# Patient Record
Sex: Male | Born: 1975 | Race: White | Hispanic: No | Marital: Married | Smoking: Never smoker
Health system: Southern US, Community
[De-identification: ages and names within clinical notes are randomized; demographics above are authoritative.]

## PROBLEM LIST (undated history)

## (undated) DIAGNOSIS — N200 Calculus of kidney: Secondary | ICD-10-CM

## (undated) HISTORY — PX: HERNIA REPAIR: SHX51

---

## 2018-06-29 ENCOUNTER — Emergency Department (HOSPITAL_BASED_OUTPATIENT_CLINIC_OR_DEPARTMENT_OTHER): Payer: Worker's Compensation

## 2018-06-29 ENCOUNTER — Other Ambulatory Visit: Payer: Self-pay

## 2018-06-29 ENCOUNTER — Encounter (HOSPITAL_BASED_OUTPATIENT_CLINIC_OR_DEPARTMENT_OTHER): Payer: Self-pay | Admitting: Emergency Medicine

## 2018-06-29 ENCOUNTER — Emergency Department (HOSPITAL_BASED_OUTPATIENT_CLINIC_OR_DEPARTMENT_OTHER)
Admission: EM | Admit: 2018-06-29 | Discharge: 2018-06-29 | Disposition: A | Discharge status: Home / Self Care | Payer: Worker's Compensation | Attending: Emergency Medicine | Admitting: Emergency Medicine

## 2018-06-29 DIAGNOSIS — S61217A Laceration without foreign body of left little finger without damage to nail, initial encounter: Secondary | ICD-10-CM

## 2018-06-29 DIAGNOSIS — Y929 Unspecified place or not applicable: Secondary | ICD-10-CM | POA: Diagnosis not present

## 2018-06-29 DIAGNOSIS — Y990 Civilian activity done for income or pay: Secondary | ICD-10-CM | POA: Insufficient documentation

## 2018-06-29 DIAGNOSIS — S62667B Nondisplaced fracture of distal phalanx of left little finger, initial encounter for open fracture: Secondary | ICD-10-CM | POA: Diagnosis not present

## 2018-06-29 DIAGNOSIS — Y9389 Activity, other specified: Secondary | ICD-10-CM | POA: Diagnosis not present

## 2018-06-29 DIAGNOSIS — W230XXA Caught, crushed, jammed, or pinched between moving objects, initial encounter: Secondary | ICD-10-CM | POA: Insufficient documentation

## 2018-06-29 DIAGNOSIS — Z23 Encounter for immunization: Secondary | ICD-10-CM | POA: Insufficient documentation

## 2018-06-29 HISTORY — DX: Calculus of kidney: N20.0

## 2018-06-29 MED ORDER — TETANUS-DIPHTH-ACELL PERTUSSIS 5-2.5-18.5 LF-MCG/0.5 IM SUSP
0.5000 mL | Freq: Once | INTRAMUSCULAR | Status: AC
  Administered 2018-06-29: 0.5 mL via INTRAMUSCULAR
  Filled 2018-06-29: qty 0.5

## 2018-06-29 NOTE — ED Provider Notes (Signed)
[facility] EMERGENCY DEPARTMENT Provider Note   CSN: [id number] Arrival date & time: 06/29/18  4098     History   Chief Complaint Chief Complaint  Patient presents with  . Laceration    HPI Edward Elliott is a 42 y.o. male.  The history is provided by the patient and medical records. No language interpreter was used.  Laceration   The incident occurred less than 1 hour ago. The laceration is located on the left hand. The laceration is 2 cm in size. The laceration mechanism was a a blunt object. The pain is at a severity of 3/10. The pain is mild. The pain has been constant since onset. He reports no foreign bodies present. His tetanus status is unknown.    Past Medical History:  Diagnosis Date  . Kidney calculi     There are no active problems to display for this patient.      Home Medications    Prior to Admission medications   Not on File    Family History No family history on file.  Social History Social History   Tobacco Use  . Smoking status: Never Smoker  Substance Use Topics  . Alcohol use: Not on file  . Drug use: Not on file     Allergies   Amoxicillin and Augmentin [amoxicillin-pot clavulanate]   Review of Systems Review of Systems  Constitutional: Negative for chills, fatigue and fever.  HENT: Negative for congestion.   Eyes: Negative for visual disturbance.  Respiratory: Negative for chest tightness and shortness of breath.   Cardiovascular: Negative for chest pain.  Gastrointestinal: Negative for abdominal pain, nausea and vomiting.  Genitourinary: Negative for difficulty urinating, dysuria, flank pain and frequency.  Musculoskeletal: Negative for back pain.  Skin: Positive for wound. Negative for rash.  Neurological: Negative for dizziness, weakness, light-headedness and headaches.  Psychiatric/Behavioral: Negative for agitation.  All other systems reviewed and are negative.    Physical Exam Updated Vital Signs BP  (!) 153/107   Pulse 68   Temp 98.6 F (37 C) (Oral)   Resp 18   Ht 5\' 9"  (1.753 m)   Wt 103.9 kg   SpO2 98%   BMI 33.82 kg/m   Physical Exam  Constitutional: He appears well-developed and well-nourished. No distress.  HENT:  Head: Normocephalic and atraumatic.  Eyes: Conjunctivae are normal.  Neck: Neck supple.  Cardiovascular: Normal rate.  No murmur heard. Pulmonary/Chest: Effort normal and breath sounds normal. No respiratory distress. He has no wheezes. He exhibits no tenderness.  Abdominal: Soft. There is no tenderness.  Musculoskeletal: He exhibits tenderness. He exhibits no edema.       Left hand: He exhibits tenderness and laceration. He exhibits normal capillary refill and no deformity. Normal sensation noted. Normal strength noted.       Hands: 2 cm laceration dorsum of the left fifth digit.  Punctate, 1 mm laceration on the pad.  Normal cap refill.  Normal sensation.  Normal finger movement.  Normal pulse.  No snuffbox tenderness.  Neurological: He is alert. No sensory deficit. He exhibits normal muscle tone.  Skin: Skin is warm and dry. Capillary refill takes less than 2 seconds. He is not diaphoretic. No erythema. No pallor.  Psychiatric: He has a normal mood and affect.  Nursing note and vitals reviewed.    ED Treatments / Results  Labs (all labs ordered are listed, but only abnormal results are displayed) Labs Reviewed - No data to display  EKG None  Radiology No results found.  Procedures .Marland KitchenLaceration Repair Date/Time: 06/29/2018 9:12 AM Performed by: Heide Scales, MD Authorized by: Heide Scales, MD   Consent:    Consent obtained:  Verbal   Consent given by:  Patient   Risks discussed:  Infection, pain, poor cosmetic result and poor wound healing   Alternatives discussed:  No treatment Anesthesia (see MAR for exact dosages):    Anesthesia method:  None Laceration details:    Location:  Finger   Finger location:  L small  finger   Length (cm):  2   Depth (mm):  1 Repair type:    Repair type:  Simple Pre-procedure details:    Preparation:  Imaging obtained to evaluate for foreign bodies Exploration:    Wound exploration: wound explored through full range of motion     Wound extent: underlying fracture (possible)     Wound extent: no foreign bodies/material noted, no muscle damage noted, no nerve damage noted, no tendon damage noted and no vascular damage noted     Contaminated: no   Treatment:    Area cleansed with:  Saline   Amount of cleaning:  Standard   Irrigation solution:  Sterile water   Irrigation method:  Syringe   Visualized foreign bodies/material removed: no   Skin repair:    Repair method:  Tissue adhesive Approximation:    Approximation:  Close Post-procedure details:    Dressing:  Non-adherent dressing and sterile dressing   Patient tolerance of procedure:  Tolerated well, no immediate complications   (including critical care time)  Medications Ordered in ED Medications  Tdap (BOOSTRIX) injection 0.5 mL (0.5 mLs Intramuscular Given 06/29/18 0724)     Initial Impression / Assessment and Plan / ED Course  I have reviewed the triage vital signs and the nursing notes.  Pertinent labs & imaging results that were available during my care of the patient were reviewed by me and considered in my medical decision making (see chart for details).     Edward Elliott is a 42 y.o. male with a past medical history significant for kidney stone who presents with hand injury.  Patient reports he is right-handed.  He says that he was moving a rolling heavy table when his left pinky got caught between 2 tables.  Reports there would not topped.  He reports that the tables were cleaned.  He says that he sustained an injury to the left pinky as it was shared between them.  He reports it was tingly at first but then improved.  He reports a laceration on both sides of the finger.  He reports the pain is now  a 3 out of 10 and mild.  He has no other complaints of injuries.  No preceding symptoms.  No other complaints.  He is uncertain of his last tetanus shot.  On exam, patient is a 2 cm laceration on the dorsal side of the fifth digit of the left hand.  There is also a punctate 1 mm cut on the pad of the fifth digit.  Patient had normal radial pulse.  Normal sensation.  No snuffbox tenderness.  Lungs clear chest nontender.  Patient otherwise appears well.  Patient with x-ray to look for fracture with his laceration.  After x-ray to rule out foreign bodies and fracture, anticipate laceration repair with stitches and hand follow-up.       Tdap will be ordered.  9:14 AM X-ray showed possible fracture of the distal phalanx.  It is  nondisplaced.  A foreign body was seen but at a different location, do not feel there is foreign body in the injury today.  Shared decision making conversation held with patient as to further management.  Patient is adverse to needles and does not want sutures.  He is very happy with skin glue.  I advised that the wound may not heal quite as well or come together as smoothly however he continues to want glue.  He understands risk for scar and infections.  Patient's wound was washed without difficulty and skin glue was applied to the lacerations.  Wound was wrapped and finger was splinted.  Patient will follow-up with PCP and was also given follow-up for hand surgery if symptoms arise.  We discussed possibility of antibiotics and decided to hold on antibiotics at this time given the only possibility of fracture, clean nature of the injury, and well appearance.  Patient understands return precautions.  Patient with questions or concerns and was discharged in good condition.    Final Clinical Impressions(s) / ED Diagnoses   Final diagnoses:  Laceration of left little finger without damage to nail, foreign body presence unspecified, initial encounter  Open nondisplaced fracture of  distal phalanx of left little finger, initial encounter    ED Discharge Orders    None     Clinical Impression: 1. Laceration of left little finger without damage to nail, foreign body presence unspecified, initial encounter   2. Open nondisplaced fracture of distal phalanx of left little finger, initial encounter     Disposition: Discharge  Condition: Good  I have discussed the results, Dx and Tx plan with the pt(& family if present). He/she/they expressed understanding and agree(s) with the plan. Discharge instructions discussed at great length. Strict return precautions discussed and pt &/or family have verbalized understanding of the instructions. No further questions at time of discharge.    New Prescriptions   No medications on file    Follow Up: Knute Neu, MD [address] [place] [address] [phone]     [facility] [place] [facility] [address] [id number] YQ [facility] [place] [address] [phone]       Edward Elliott, Canary Brim, MD 06/29/18 [id number]

## 2018-06-29 NOTE — ED Triage Notes (Signed)
Pt was at work and caught left pinky in between to rolling metal tables. Bleeding controlled in triage. PMS intact.

## 2018-06-29 NOTE — Discharge Instructions (Signed)
Your work-up today showed possible finger fracture but definite lacerations.  After our shared decision-making conversation, we agreed to use the skin glue to seal the wound.  Despite washing and tetanus update, there are still chances for infection, please return if any symptoms of infection arise.  Please use the splint for the next few days.  If you continue to have any pain or problems, please follow-up with the hand surgery team.  Please keep the wound clean.

## 2020-05-21 IMAGING — CR DG FINGER LITTLE 2+V*L*
3 series · 3 of 3 positions shown · non-contrast
Comparison: None.

CLINICAL DATA: Crush injury at work.

EXAM:
LEFT LITTLE FINGER 2+V

[x finger pa left]
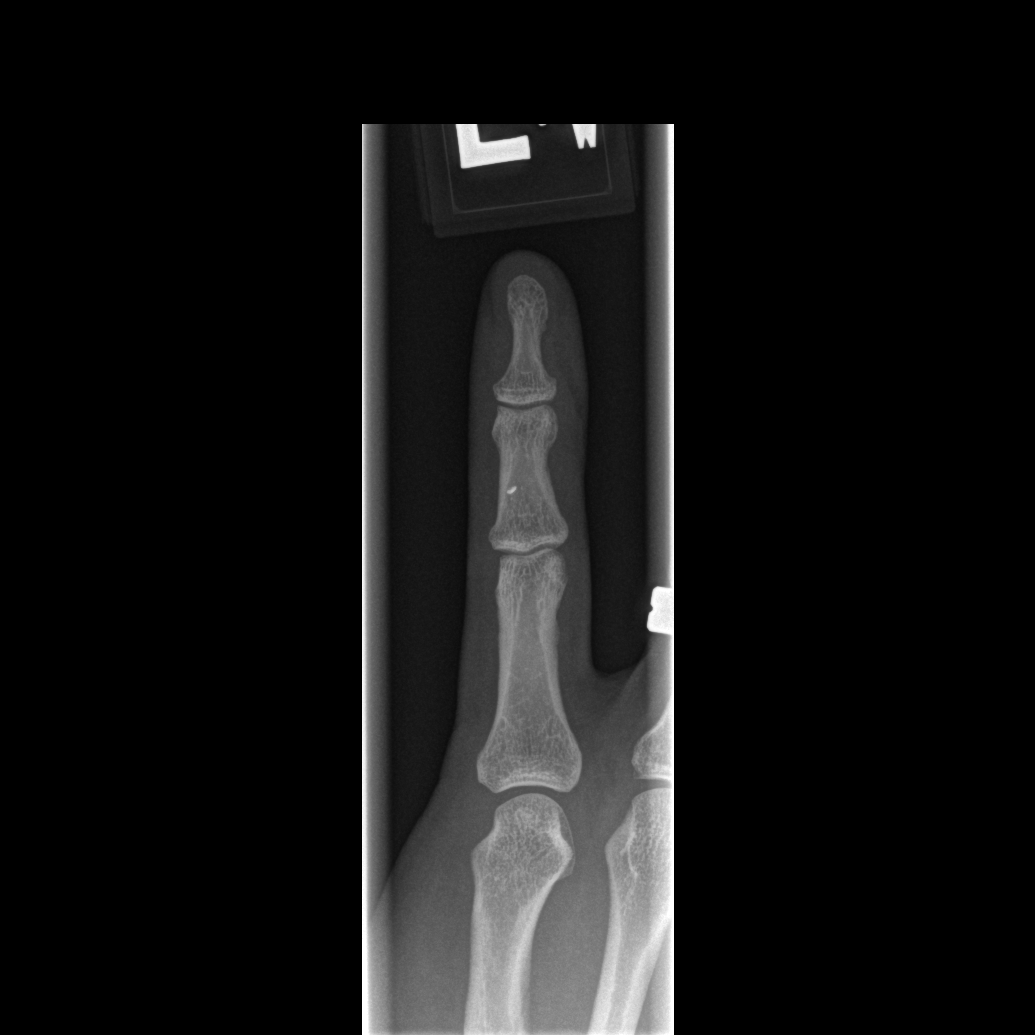

[x finger obl. left]
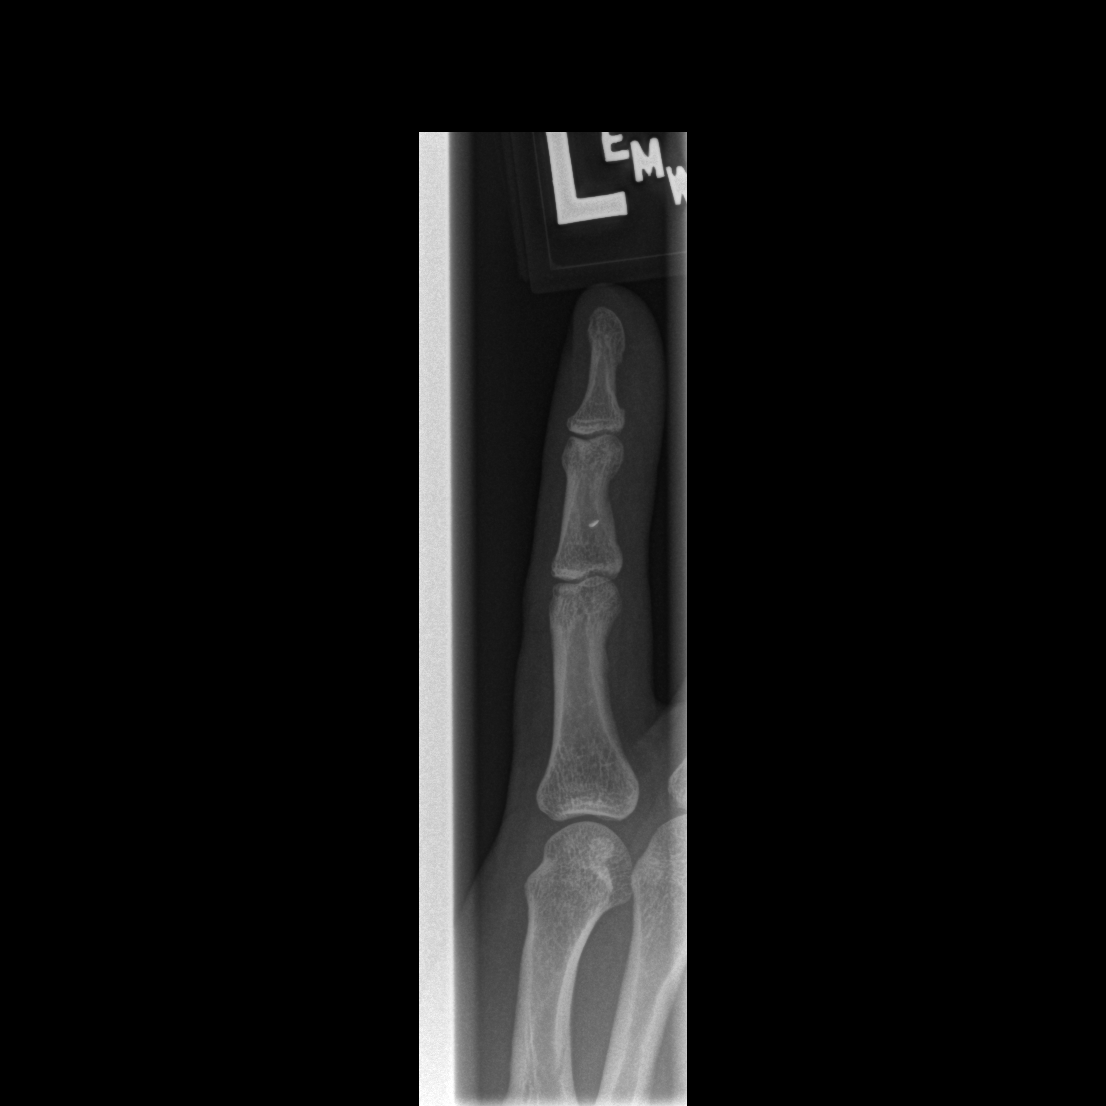

[x finger lateral left]
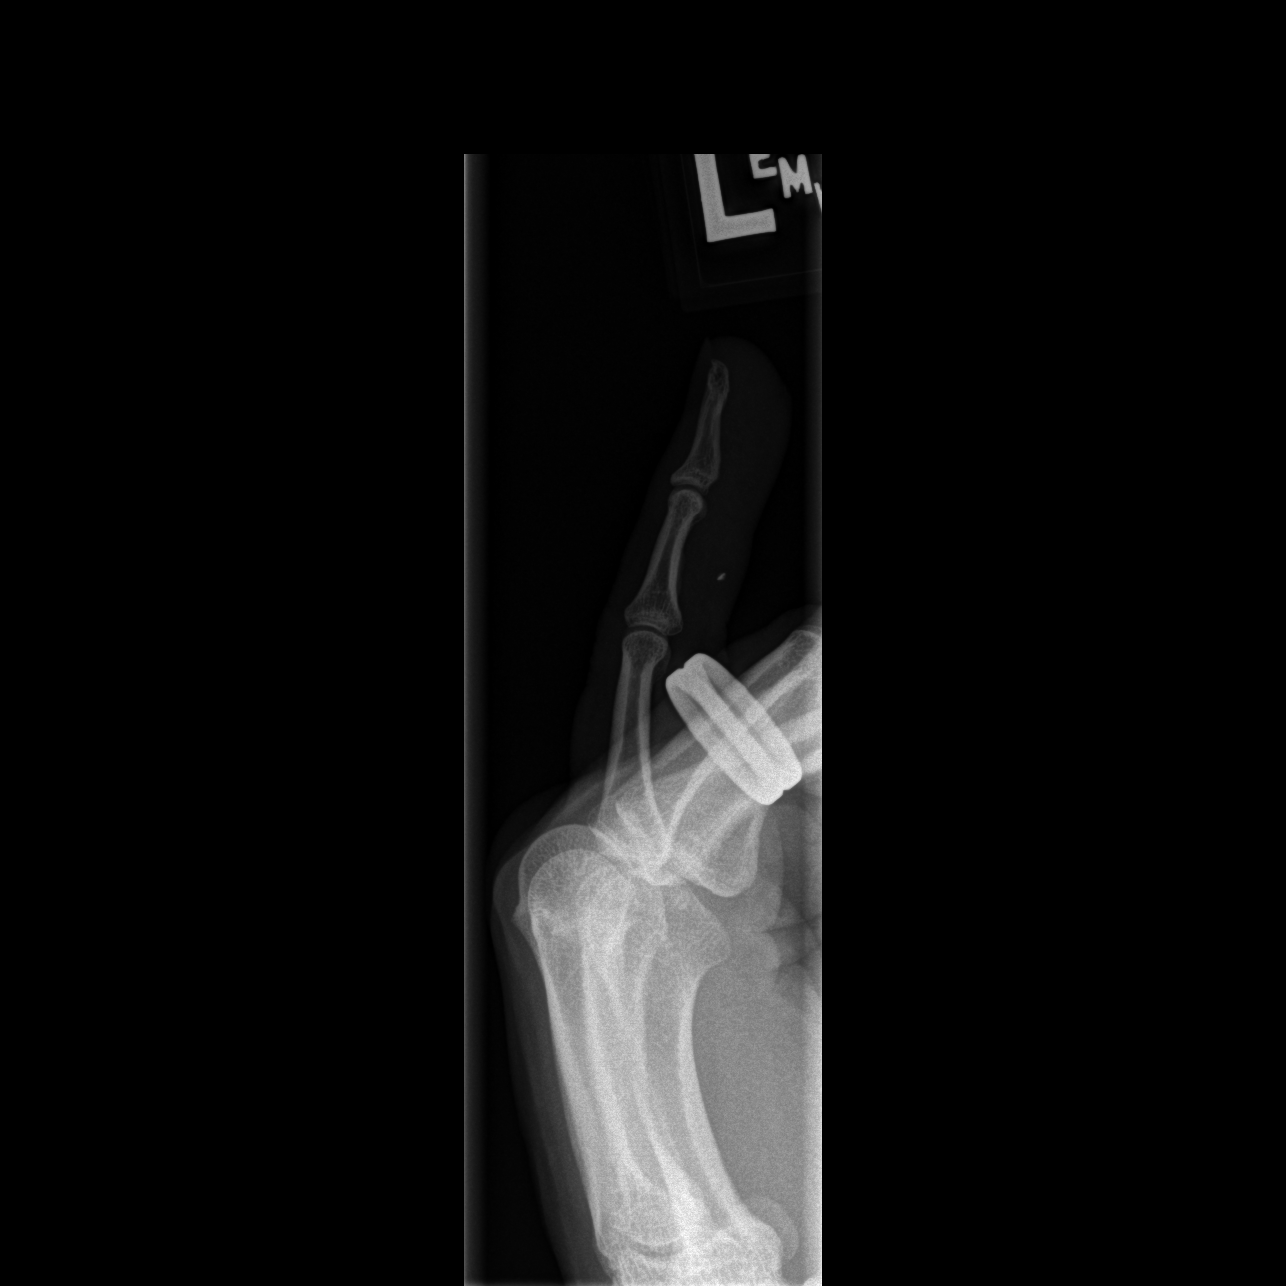

[3 of 3 positions shown; findings below may reference images not displayed]

FINDINGS: There is a punctate (2 mm) radiopaque foreign body imbedded within
the soft tissues about the plantar aspect of the middle phalanx of
the fifth digit.

There is a ill-defined oblique lucency involving the distal tuft of
the fifth digit which may represent a nutrient foramen versus a
nondisplaced fracture. No intra-articular extension.

No additional fractures are identified. Joint spaces are preserved.
No erosions.
IMPRESSION: 1. Foreign age-indeterminate punctate (body imbedded within the soft
tissues 2 mm) about the plantar aspect of the middle phalanx of the
fifth digit.
2. Ill-defined oblique lucency involving the distal tuft of the
fifth digit may represent a prominent nutrient foramen versus a
nondisplaced fracture. Correlation point tenderness at this location
is advised. No definitive intra-articular extension.

## 2020-12-13 ENCOUNTER — Ambulatory Visit (INDEPENDENT_AMBULATORY_CARE_PROVIDER_SITE_OTHER): Payer: BC Managed Care – PPO

## 2020-12-13 ENCOUNTER — Other Ambulatory Visit: Payer: Self-pay

## 2020-12-13 ENCOUNTER — Encounter: Payer: Self-pay | Admitting: Podiatry

## 2020-12-13 ENCOUNTER — Ambulatory Visit: Payer: BC Managed Care – PPO | Admitting: Podiatry

## 2020-12-13 DIAGNOSIS — S99921A Unspecified injury of right foot, initial encounter: Secondary | ICD-10-CM

## 2020-12-13 DIAGNOSIS — M7751 Other enthesopathy of right foot: Secondary | ICD-10-CM | POA: Diagnosis not present

## 2020-12-13 MED ORDER — DICLOFENAC SODIUM 75 MG PO TBEC: 75.0000 mg | DELAYED_RELEASE_TABLET | Freq: Two times a day (BID) | ORAL | 2 refills | Status: DC

## 2020-12-13 MED ORDER — TRIAMCINOLONE ACETONIDE 10 MG/ML IJ SUSP
10.0000 mg | Freq: Once | INTRAMUSCULAR | Status: AC
  Administered 2020-12-13: 10 mg

## 2020-12-13 NOTE — Progress Notes (Signed)
Subjective:   Patient ID: Edward Elliott, male   DOB: 45 y.o.   MRN: [id number]   HPI Patient presents stating that he had a foot and ankle injury a year ago and states he has a sharp pain that is radiating to the ankle and top of the foot.  States he had an inversion injury and that at times it seems better and at other times it really bothers him especially with activity.  Patient does not smoke likes to be active   Review of Systems  All other systems reviewed and are negative.       Objective:  Physical Exam Vitals and nursing note reviewed.  Constitutional:      Appearance: He is well-developed.  Pulmonary:     Effort: Pulmonary effort is normal.  Musculoskeletal:        General: Normal range of motion.  Skin:    General: Skin is warm.  Neurological:     Mental Status: He is alert.     Neurovascular status intact muscle strength adequate range of motion adequate with exquisite discomfort into the sinus tarsi right with inflammation and no indication of ligamentous laxity when I compared the right than the left.  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  Patient will be seen back to recheck  X-rays indicate that there is no signs currently of osteochondral injury or diastases or fracture of the right ankle      l

## 2020-12-19 ENCOUNTER — Ambulatory Visit: Payer: BC Managed Care – PPO | Admitting: Podiatry

## 2021-01-04 ENCOUNTER — Ambulatory Visit: Payer: BC Managed Care – PPO | Admitting: Podiatry

## 2021-05-13 ENCOUNTER — Other Ambulatory Visit: Payer: Self-pay | Admitting: Podiatry
